# Patient Record
Sex: Male | Born: 1965
Health system: Southern US, Community
[De-identification: ages and names within clinical notes are randomized; demographics above are authoritative.]

## PROBLEM LIST (undated history)

## (undated) DIAGNOSIS — F32A Depression, unspecified: Secondary | ICD-10-CM

## (undated) DIAGNOSIS — F329 Major depressive disorder, single episode, unspecified: Secondary | ICD-10-CM

## (undated) DIAGNOSIS — I10 Essential (primary) hypertension: Secondary | ICD-10-CM

## (undated) DIAGNOSIS — R519 Headache, unspecified: Secondary | ICD-10-CM

## (undated) DIAGNOSIS — R12 Heartburn: Secondary | ICD-10-CM

## (undated) DIAGNOSIS — F419 Anxiety disorder, unspecified: Secondary | ICD-10-CM

## (undated) DIAGNOSIS — R51 Headache: Secondary | ICD-10-CM

## (undated) DIAGNOSIS — E119 Type 2 diabetes mellitus without complications: Secondary | ICD-10-CM

## (undated) DIAGNOSIS — R413 Other amnesia: Secondary | ICD-10-CM

## (undated) HISTORY — DX: Depression, unspecified: F32.A

## (undated) HISTORY — DX: Other amnesia: R41.3

## (undated) HISTORY — DX: Headache, unspecified: R51.9

## (undated) HISTORY — DX: Type 2 diabetes mellitus without complications: E11.9

## (undated) HISTORY — DX: Heartburn: R12

## (undated) HISTORY — DX: Essential (primary) hypertension: I10

## (undated) HISTORY — DX: Major depressive disorder, single episode, unspecified: F32.9

## (undated) HISTORY — DX: Headache: R51

## (undated) HISTORY — PX: NO PAST SURGERIES: SHX2092

## (undated) HISTORY — DX: Anxiety disorder, unspecified: F41.9

## (undated) HISTORY — PX: OTHER SURGICAL HISTORY: SHX169

---

## 2008-05-06 ENCOUNTER — Ambulatory Visit: Payer: Self-pay | Admitting: Family Medicine

## 2008-07-13 DIAGNOSIS — E119 Type 2 diabetes mellitus without complications: Secondary | ICD-10-CM

## 2008-07-13 HISTORY — DX: Type 2 diabetes mellitus without complications: E11.9

## 2009-06-17 ENCOUNTER — Ambulatory Visit: Payer: Self-pay | Admitting: Family Medicine

## 2009-10-01 ENCOUNTER — Ambulatory Visit: Payer: Self-pay | Admitting: Family Medicine

## 2009-12-17 ENCOUNTER — Ambulatory Visit: Payer: Self-pay | Admitting: Urology

## 2010-07-14 ENCOUNTER — Ambulatory Visit: Payer: Self-pay | Admitting: Internal Medicine

## 2012-08-17 ENCOUNTER — Ambulatory Visit: Payer: Self-pay | Admitting: Unknown Physician Specialty

## 2012-10-06 ENCOUNTER — Ambulatory Visit: Payer: Self-pay | Admitting: Gastroenterology

## 2012-12-21 ENCOUNTER — Ambulatory Visit: Payer: Self-pay | Admitting: Family Medicine

## 2013-01-04 ENCOUNTER — Ambulatory Visit: Payer: Self-pay | Admitting: Family Medicine

## 2013-01-04 LAB — CREATININE, SERUM: EGFR (Non-African Amer.): >60

## 2014-12-06 ENCOUNTER — Other Ambulatory Visit: Payer: Self-pay | Admitting: Family Medicine

## 2014-12-06 DIAGNOSIS — M5412 Radiculopathy, cervical region: Secondary | ICD-10-CM

## 2014-12-14 ENCOUNTER — Ambulatory Visit: Payer: No Typology Code available for payment source

## 2014-12-19 ENCOUNTER — Ambulatory Visit
Admission: RE | Admit: 2014-12-19 | Discharge: 2014-12-19 | Disposition: A | Discharge status: Home / Self Care | Payer: No Typology Code available for payment source | Source: Ambulatory Visit | Attending: Family Medicine | Admitting: Family Medicine

## 2014-12-19 DIAGNOSIS — M5022 Other cervical disc displacement, mid-cervical region: Secondary | ICD-10-CM | POA: Diagnosis not present

## 2014-12-19 DIAGNOSIS — M5412 Radiculopathy, cervical region: Secondary | ICD-10-CM

## 2014-12-19 DIAGNOSIS — M5030 Other cervical disc degeneration, unspecified cervical region: Secondary | ICD-10-CM | POA: Diagnosis not present

## 2014-12-19 DIAGNOSIS — M4802 Spinal stenosis, cervical region: Secondary | ICD-10-CM | POA: Insufficient documentation

## 2015-02-12 ENCOUNTER — Other Ambulatory Visit: Payer: Self-pay | Admitting: Family Medicine

## 2015-02-12 DIAGNOSIS — N5089 Other specified disorders of the male genital organs: Secondary | ICD-10-CM

## 2015-02-15 ENCOUNTER — Ambulatory Visit: Payer: No Typology Code available for payment source

## 2015-02-19 ENCOUNTER — Ambulatory Visit
Admission: RE | Admit: 2015-02-19 | Discharge: 2015-02-19 | Disposition: A | Discharge status: Home / Self Care | Payer: No Typology Code available for payment source | Source: Ambulatory Visit | Attending: Family Medicine | Admitting: Family Medicine

## 2015-02-19 DIAGNOSIS — N5089 Other specified disorders of the male genital organs: Secondary | ICD-10-CM

## 2015-02-19 DIAGNOSIS — N509 Disorder of male genital organs, unspecified: Secondary | ICD-10-CM | POA: Insufficient documentation

## 2015-02-19 DIAGNOSIS — N433 Hydrocele, unspecified: Secondary | ICD-10-CM | POA: Diagnosis not present

## 2015-02-19 DIAGNOSIS — I861 Scrotal varices: Secondary | ICD-10-CM | POA: Diagnosis not present

## 2015-02-19 DIAGNOSIS — N503 Cyst of epididymis: Secondary | ICD-10-CM | POA: Insufficient documentation

## 2015-06-27 ENCOUNTER — Ambulatory Visit: Payer: Self-pay | Admitting: Urology

## 2015-07-04 ENCOUNTER — Ambulatory Visit (INDEPENDENT_AMBULATORY_CARE_PROVIDER_SITE_OTHER): Payer: Managed Care, Other (non HMO) | Admitting: Urology

## 2015-07-04 ENCOUNTER — Encounter: Payer: Self-pay | Admitting: Urology

## 2015-07-04 VITALS — BP 135/75 | HR 97 | Ht 78.0 in | Wt 222.3 lb

## 2015-07-04 DIAGNOSIS — I861 Scrotal varices: Secondary | ICD-10-CM | POA: Diagnosis not present

## 2015-07-04 DIAGNOSIS — N50819 Testicular pain, unspecified: Secondary | ICD-10-CM

## 2015-07-04 DIAGNOSIS — N433 Hydrocele, unspecified: Secondary | ICD-10-CM

## 2015-07-04 LAB — MICROSCOPIC EXAMINATION: BACTERIA UA: NONE SEEN

## 2015-07-04 LAB — URINALYSIS, COMPLETE
Bilirubin, UA: NEGATIVE
KETONES UA: NEGATIVE
Leukocytes, UA: NEGATIVE
NITRITE UA: NEGATIVE
Protein, UA: NEGATIVE
RBC UA: NEGATIVE
SPEC GRAV UA: 1.02 (ref 1.005–1.030)
UUROB: 0.2 mg/dL (ref 0.2–1.0)
pH, UA: 5.5 (ref 5.0–7.5)

## 2015-07-04 NOTE — Progress Notes (Signed)
07/04/2015 3:18 PM   Miguel Hughes 07/21/1965 [id number]  Referring provider: Dion Body, MD [facility] [place], [place] [address]  Chief Complaint  Patient presents with  . Testicular cyst    referred by Dr. Adella Hare    HPI: Patient is a 49 year old African-American male who is referred to [place] by Dr. Eddie Dibbles for a testicular cyst.  Patient states that since 2010 he's been having testicular pain and an increase in the size of the scrotum.   He and his wife states they have been told by several practitioners that it was nothing to worry about and not to be concerned until he had increasing pain in his scrotum.  He was last seen at [facility] and was placed on antibiotics, but he did not experience any relief with his testicular pain.  Now, he states his scrotum has increased twice in size.  The scrotum aches continuously. He is experiencing bouts of intense pain that can last for several minutes to hours.  The  size of his scrotum does not fluctuate throughout the day. He has not had any difficulty with urination or erections.  He has not experienced dysuria, gross hematuria or suprapubic pain. He also has not experienced any abdominal pain, flank pain or back pain.  He does not have any prior history of nephrolithiasis.  He has not had any associated fevers, chills, nausea or vomiting.  His UA today was unremarkable.  He does have a maternal uncle that was diagnosed with prostate cancer. His uncle is still living.  A scrotal ultrasound performed on 02/19/2015 noted no significant testicular mass. No evidence of torsion. Bilateral epididymal cysts. Bilateral hydroceles, moderate on the right, small on the left. Small bilateral varicoceles. I reviewed the images with the patient and agree with the findings.  PMH: Past Medical History  Diagnosis Date  . Anxiety and depression   . Heartburn   . Diabetes ([place])   . HTN (hypertension)   . Headache   .  Memory loss     Surgical History: Past Surgical History  Procedure Laterality Date  . None      Home Medications:    Medication List       This list is accurate as of: 07/04/15  3:18 PM.  Always use your most recent med list.               aspirin 325 MG tablet  Take by mouth.     cyclobenzaprine 10 MG tablet  Commonly known as:  FLEXERIL  TAKE 1 TABLET BY MOUTH 3 TIMES A DAY AS NEEDED MUSCLE SPASM     dexamethasone 2 MG tablet  Commonly known as:  DECADRON  Reported on 07/04/2015     gabapentin 300 MG capsule  Commonly known as:  NEURONTIN  Take 600 mg by mouth.     insulin aspart 100 UNIT/ML FlexPen  Commonly known as:  NOVOLOG  Reported on 07/04/2015     lisinopril 20 MG tablet  Commonly known as:  PRINIVIL,ZESTRIL  Take by mouth.     metFORMIN 1000 MG tablet  Commonly known as:  GLUCOPHAGE  Take by mouth.     sitaGLIPtin 25 MG tablet  Commonly known as:  JANUVIA  Take by mouth.     TOUJEO SOLOSTAR 300 UNIT/ML Sopn  Generic drug:  Insulin Glargine  Inject 4 Units into the skin.     V-R IBUPROFEN JR 100 MG tablet  Generic drug:  ibuprofen  Take by mouth. Reported on 07/04/2015     Vitamin D (Ergocalciferol) 50000 UNITS Caps capsule  Commonly known as:  DRISDOL  Take by mouth.        Allergies: No Known Allergies  Family History: Family History  Problem Relation Age of Onset  . Kidney disease Neg Hx   . Prostate cancer Maternal Uncle   . Asthma Mother   . Hypertension      Social History:  reports that he has never smoked. He does not have any smokeless tobacco history on file. He reports that he does not drink alcohol or use illicit drugs.  ROS: UROLOGY Frequent Urination?: No Hard to postpone urination?: No Burning/pain with urination?: No Get up at night to urinate?: No Leakage of urine?: No Urine stream starts and stops?: No Trouble starting stream?: No Do you have to strain to urinate?: No Blood in urine?: No Urinary  tract infection?: No Sexually transmitted disease?: No Injury to kidneys or bladder?: No Painful intercourse?: No Weak stream?: No Erection problems?: No Penile pain?: No  Gastrointestinal Nausea?: No Vomiting?: No Indigestion/heartburn?: Yes Diarrhea?: Yes Constipation?: Yes  Constitutional Fever: No Night sweats?: Yes Weight loss?: No Fatigue?: No  Skin Skin rash/lesions?: No Itching?: No  Eyes Blurred vision?: Yes Double vision?: No  Ears/Nose/Throat Sore throat?: No Sinus problems?: No  Hematologic/Lymphatic Swollen glands?: No Easy bruising?: No  Cardiovascular Leg swelling?: No Chest pain?: No  Respiratory Cough?: No Shortness of breath?: No  Endocrine Excessive thirst?: No  Musculoskeletal Back pain?: Yes Joint pain?: No  Neurological Headaches?: Yes Dizziness?: Yes  Psychologic Depression?: Yes Anxiety?: Yes  Physical Exam: BP 135/75 mmHg  Pulse 97  Ht 6\' 6"  (1.981 m)  Wt 222 lb 4.8 oz (100.835 kg)  BMI 25.69 kg/m2  Constitutional: Well nourished. Alert and oriented, No acute distress. HEENT: [place] AT, moist mucus membranes. Trachea midline, no masses. Cardiovascular: No clubbing, cyanosis, or edema. Respiratory: Normal respiratory effort, no increased work of breathing. GI: Abdomen is soft, non tender, non distended, no abdominal masses. Liver and spleen not palpable.  No hernias appreciated.  Stool sample for occult testing is not indicated.   GU: No CVA tenderness.  No bladder fullness or masses.  Patient with circumcised phallus.  Urethral meatus is patent.  No penile discharge. No penile lesions or rashes. Scrotum without lesions, cysts, rashes and/or edema.  Testicles are located scrotally bilaterally. No masses are appreciated in the testicles. Left and right epididymis are normal.  Epididymal cysts are palpated. The hydroceles are not impressive on this exam. Because of this, I asked Dr. Pilar Jarvis to also examine the patient.  He  noted a reducible sac contained within the right scrotum. He advised further evaluation with general surgery for possible hernia. Rectal: Patient with  normal sphincter tone. Anus and perineum without scarring or rashes. No rectal masses are appreciated. Prostate is approximately 50 grams, no nodules are appreciated. Seminal vesicles are normal. Skin: No rashes, bruises or suspicious lesions. Lymph: No cervical or inguinal adenopathy. Neurologic: Grossly intact, no focal deficits, moving all 4 extremities. Psychiatric: Normal mood and affect.  Laboratory Data:  Lab Results  Component Value Date   CREATININE 1.12 01/04/2013    PSA history  1.52 ng/mL on 06/14/2015  0.33 ng/mL of free PSA on 06/14/2015  Percent free 21.7% on 06/14/2015  Urinalysis Results for orders placed or performed in visit on 07/04/15  Microscopic Examination  Result Value Ref Range   WBC, UA 0-5 0 -  5 /hpf   RBC, UA 0-2 0 -  2 /hpf   Epithelial Cells (non renal) 0-10 0 - 10 /hpf   Mucus, UA Present (A) Not Estab.   Bacteria, UA None seen None seen/Few  Urinalysis, Complete  Result Value Ref Range   Specific Gravity, UA 1.020 1.005 - 1.030   pH, UA 5.5 5.0 - 7.5   Color, UA Yellow Yellow   Appearance Ur Clear Clear   Leukocytes, UA Negative Negative   Protein, UA Negative Negative/Trace   Glucose, UA Trace (A) Negative   Ketones, UA Negative Negative   RBC, UA Negative Negative   Bilirubin, UA Negative Negative   Urobilinogen, Ur 0.2 0.2 - 1.0 mg/dL   Nitrite, UA Negative Negative   Microscopic Examination See below:    Pertinent Imaging: CLINICAL DATA: Scrotal mass.  EXAM: SCROTAL ULTRASOUND  DOPPLER ULTRASOUND OF THE TESTICLES  TECHNIQUE: Complete ultrasound examination of the testicles, epididymis, and other scrotal structures was performed. Color and spectral Doppler ultrasound were also utilized to evaluate blood flow to the testicles.  COMPARISON:  12/17/2009.  FINDINGS: Right testicle  Measurements: 4.6 x 2.8 x 3.2 cm. No mass or microlithiasis visualized. Cyst noted in the rete testes. These are slightly more prominent than on prior exam.  Left testicle  Measurements: 4.9 x 2.7 x 2.9 cm. No mass or microlithiasis visualized.  Right epididymis: Epididymal cysts, the largest measures 2.2 cm  Left epididymis: Epididymal cysts, the largest measures 1.0 cm  Hydrocele: Moderate right hydrocele. Small left hydrocele.  Varicocele: Small varicoceles bilaterally.  Pulsed Doppler interrogation of both testes demonstrates normal low resistance arterial and venous waveforms bilaterally.  IMPRESSION: 1. No significant testicular mass. No evidence of torsion.  2. Bilateral epididymal cyst.  3. Bilateral hydroceles, moderate on the right, small left.  4. Small bilateral varicoceles.   Electronically Signed  By: [place]  On: 02/19/2015 17:00   Assessment & Plan:    1. Testicular pain:   A reducible sac was palpated in the right scrotum. He'll be referred to general surgery for further evaluation for possible hernia to see if this is the source of his discomfort.  He will also take scheduled ibuprofen at 600 mg 4 times daily for 2 weeks to see if this diminishes his testicular pain.  - Urinalysis, Complete - CULTURE, URINE COMPREHENSIVE  2. Bilateral hydroceles:   Hydroceles are not impressive on this exam.  I do not believe that this is the source of his testicular pain at this time.  He will continue conservative management of the hydroceles at this time.  He will follow-up in 2 weeks and after his appointment with general surgery if the pain in his testicles persists.  3. Bilateral varicoceles:   Varicoceles are not impressive on this exam. He will continue conservative management of the varicoceles at this time. He'll be referred to general surgery for their opinion regarding a possible  hernia and he will be taking scheduled ibuprofen for 2 weeks.   No Follow-up on file.  Zara Council, [facility] [address], [place], [place] [address] [phone]

## 2015-07-05 DIAGNOSIS — N50819 Testicular pain, unspecified: Secondary | ICD-10-CM | POA: Insufficient documentation

## 2015-07-05 DIAGNOSIS — I861 Scrotal varices: Secondary | ICD-10-CM | POA: Insufficient documentation

## 2015-07-05 DIAGNOSIS — N433 Hydrocele, unspecified: Secondary | ICD-10-CM | POA: Insufficient documentation

## 2015-07-06 LAB — CULTURE, URINE COMPREHENSIVE

## 2015-07-24 ENCOUNTER — Ambulatory Visit: Payer: Self-pay | Admitting: General Surgery

## 2015-08-05 ENCOUNTER — Other Ambulatory Visit: Payer: Self-pay | Admitting: Rheumatology

## 2015-08-05 DIAGNOSIS — G252 Other specified forms of tremor: Secondary | ICD-10-CM

## 2015-08-07 ENCOUNTER — Ambulatory Visit (INDEPENDENT_AMBULATORY_CARE_PROVIDER_SITE_OTHER): Payer: Managed Care, Other (non HMO) | Admitting: General Surgery

## 2015-08-07 ENCOUNTER — Encounter: Payer: Self-pay | Admitting: General Surgery

## 2015-08-07 VITALS — BP 142/92 | HR 70 | Resp 12 | Ht 78.0 in | Wt 227.0 lb

## 2015-08-07 DIAGNOSIS — N5089 Other specified disorders of the male genital organs: Secondary | ICD-10-CM

## 2015-08-07 DIAGNOSIS — N509 Disorder of male genital organs, unspecified: Secondary | ICD-10-CM

## 2015-08-07 NOTE — Patient Instructions (Signed)
The patient is aware to call back for any questions or concerns.  

## 2015-08-07 NOTE — Progress Notes (Signed)
Patient ID: Miguel Hughes, male   DOB: Feb 14, 1966, 50 y.o.   MRN: [id number]  Chief Complaint  Patient presents with  . Other    bilateral inguinal hernia    HPI Miguel Hughes is a 50 y.o. male.  Here today for evaluation of possible inguinal hernia. He does admit to testicle knots and discomfort. He states that the discomfort (intermitent, sharp) has been there for several years but last year it seemed to become worse and larger. The pain is not related to activity. The right side is worse than the left. He had a scrotum ultrasound on 02-19-15. He is here today with his wife of 22 years. He has been dealing with headaches and neurological issues. Diabetes is stable.  HPI  Past Medical History  Diagnosis Date  . Anxiety and depression   . Heartburn   . HTN (hypertension)   . Headache   . Memory loss   . Diabetes [facility]) 2010    Dr Eddie Dibbles    Past Surgical History  Procedure Laterality Date  . None      Family History  Problem Relation Age of Onset  . Kidney disease Neg Hx   . Prostate cancer Maternal Uncle   . Asthma Mother   . Hypertension      Social History Social History  Substance Use Topics  . Smoking status: Never Smoker   . Smokeless tobacco: Never Used  . Alcohol Use: No    No Known Allergies  Current Outpatient Prescriptions  Medication Sig Dispense Refill  . aspirin 325 MG tablet Take by mouth every 4 (four) hours as needed.     . cyclobenzaprine (FLEXERIL) 10 MG tablet TAKE 1 TABLET BY MOUTH 3 TIMES A DAY AS NEEDED MUSCLE SPASM  5  . ibuprofen (ADVIL,MOTRIN) 200 MG tablet Take 200 mg by mouth every 6 (six) hours as needed.    . Insulin Glargine (TOUJEO SOLOSTAR) 300 UNIT/ML SOPN Inject 4 Units into the skin as needed.     Marland Kitchen lisinopril (PRINIVIL,ZESTRIL) 20 MG tablet Take by mouth.    . metFORMIN (GLUCOPHAGE) 1000 MG tablet Take by mouth.    . sitaGLIPtin (JANUVIA) 25 MG tablet Take by mouth.    . Vitamin D, Ergocalciferol, (DRISDOL) 50000 UNITS  CAPS capsule Take by mouth.     No current facility-administered medications for this visit.    Review of Systems Review of Systems  Gastrointestinal: Negative for diarrhea and constipation.  Genitourinary: Negative for difficulty urinating.    Blood pressure 142/92, pulse 70, resp. rate 12, height 6\' 6"  (1.981 m), weight 227 lb (102.967 kg).  Physical Exam Physical Exam  Constitutional: He is oriented to person, place, and time. He appears well-developed and well-nourished.  HENT:  Mouth/Throat: Oropharynx is clear and moist.  Eyes: Conjunctivae are normal. No scleral icterus.  Neck: Neck supple.  Cardiovascular: Normal rate, regular rhythm and normal heart sounds.   Pulmonary/Chest: Effort normal and breath sounds normal.  Abdominal: Soft. Normal appearance. There is no tenderness. Hernia confirmed negative in the right inguinal area and confirmed negative in the left inguinal area.  Genitourinary:     scrotum tenderness.  Lymphadenopathy:    He has no cervical adenopathy.  Neurological: He is alert and oriented to person, place, and time.  Skin: Skin is warm and dry.  Psychiatric: His behavior is normal.    Data Reviewed 07/04/2015 urology evaluation note reviewed.  Ultrasound of the testes dated 02/19/2015 was reviewed. Right epididymal cyst measuring  2.2 cm, left dental cyst measuring 1 cm.  Assessment    Pain at the site of the right more so than the left epididymal cyst. No evidence of inguinal hernia.    Plan    The patient has noted gradual increase in size of the lesions in the scrotum, and increasing pain especially on the right size. No clinical evidence of inguinal hernia noted on today's exam.  The patient will likely benefit from excision of the epididymal cyst, and he should contact the urology service for reevaluation in this regard.    Follow up as needed.   PCP:  Dion Body RefZara Council, PA-C  This information has been  scribed by Karie Fetch RNBC.   Robert Bellow 08/08/2015, 6:00 PM

## 2015-08-08 DIAGNOSIS — N5089 Other specified disorders of the male genital organs: Secondary | ICD-10-CM | POA: Insufficient documentation

## 2015-08-23 ENCOUNTER — Ambulatory Visit: Payer: No Typology Code available for payment source

## 2015-09-12 ENCOUNTER — Telehealth: Payer: Self-pay | Admitting: Urology

## 2015-09-12 NOTE — Telephone Encounter (Signed)
Spoke with pt wife in reference to groin pain. Wife stated pt is still having pain. Wife was transferred to the front to make f/u appt with MD.

## 2015-09-12 NOTE — Telephone Encounter (Signed)
Patient will need a follow up appointment with one of our physicians if he is still having groin pain.

## 2015-09-17 ENCOUNTER — Ambulatory Visit (INDEPENDENT_AMBULATORY_CARE_PROVIDER_SITE_OTHER): Payer: Managed Care, Other (non HMO) | Admitting: Urology

## 2015-09-17 ENCOUNTER — Encounter: Payer: Self-pay | Admitting: Urology

## 2015-09-17 VITALS — BP 148/92 | HR 90 | Resp 16 | Ht 78.0 in | Wt 227.8 lb

## 2015-09-17 DIAGNOSIS — N433 Hydrocele, unspecified: Secondary | ICD-10-CM

## 2015-09-17 DIAGNOSIS — G8929 Other chronic pain: Secondary | ICD-10-CM

## 2015-09-17 DIAGNOSIS — N50819 Testicular pain, unspecified: Secondary | ICD-10-CM | POA: Diagnosis not present

## 2015-09-17 DIAGNOSIS — N503 Cyst of epididymis: Secondary | ICD-10-CM | POA: Diagnosis not present

## 2015-09-17 NOTE — Progress Notes (Signed)
09/17/2015 9:38 PM   Miguel Hughes 04/20/1966 [id number]  Referring provider: Dion Body, MD [place] [facility] [place], [place] [address]  Chief Complaint  Patient presents with  . Hydrocele    bilateral    HPI: 50 year old male with chronic testicular pain who returns today after being evaluated by general surgery for possible right inguinal hernia. Dr. Bary Castilla did not appreciate any significant hernia and sent the patient back to [place] for further evaluation.    Patient reports that he has had small cysts just above his testicles for approximately 20 years, but over the past 2 years, these have become painful and started to enlarge over the past 1 year. He has right greater than left testicular pain which is mostly dull and aching but occasionally stabbing. He is been seen by multiple specialists including [facility] and treated with multiple courses of antibiotics without improvement. Scrotal ultrasound shows 2.2 cm right epididymal cyst and 1 cm left epididymal cyst. He also has insignificant bilateral hydroceles which are mostly only appreciated on ultrasound. There is no other scrotal pathology identifiable on ultrasound performed on 02/2015.    Patient and his wife today reports that he is been diagnosed with unknown neurological condition. He is struggling with multiple ailments including progressive memory loss, headaches, confusion, and tremors. He is seen multiple specialist and has been treated with multiple interventions including courses of steroids, nonsteroidals, muscle relaxants and gabapentin. During each of these medication trials, he continued to have significant testicular/scrotal pain in none of these have helped with his discomfort.  They also stated he was not able to tolerate physical therapy due to his neurological conditions.  No voiding issues.  No history of testicular/ scrotal trauma or previous vasectomy.  Prostate cancer  screening up to date.   PMH: Past Medical History  Diagnosis Date  . Anxiety and depression   . Heartburn   . HTN (hypertension)   . Headache   . Memory loss   . Diabetes [facility]) 2010    Dr Eddie Dibbles    Surgical History: Past Surgical History  Procedure Laterality Date  . None      Home Medications:    Medication List       This list is accurate as of: 09/17/15  9:38 PM.  Always use your most recent med list.               aspirin 325 MG tablet  Take by mouth every 4 (four) hours as needed.     cyclobenzaprine 10 MG tablet  Commonly known as:  FLEXERIL  TAKE 1 TABLET BY MOUTH 3 TIMES A DAY AS NEEDED MUSCLE SPASM     ibuprofen 200 MG tablet  Commonly known as:  ADVIL,MOTRIN  Take 200 mg by mouth every 6 (six) hours as needed.     lisinopril 20 MG tablet  Commonly known as:  PRINIVIL,ZESTRIL  Take by mouth.     metFORMIN 1000 MG tablet  Commonly known as:  GLUCOPHAGE  Take by mouth.     sitaGLIPtin 25 MG tablet  Commonly known as:  JANUVIA  Take by mouth.     TOUJEO SOLOSTAR 300 UNIT/ML Sopn  Generic drug:  Insulin Glargine  Inject 4 Units into the skin as needed. Reported on 09/17/2015     Vitamin D (Ergocalciferol) 50000 units Caps capsule  Commonly known as:  DRISDOL  Take by mouth.        Allergies: No Known Allergies  Family History: Family History  Problem Relation Age of Onset  . Kidney disease Neg Hx   . Prostate cancer Maternal Uncle   . Asthma Mother   . Hypertension      Social History:  reports that he has never smoked. He has never used smokeless tobacco. He reports that he does not drink alcohol or use illicit drugs.  ROS: UROLOGY Frequent Urination?: No Hard to postpone urination?: No Burning/pain with urination?: No Get up at night to urinate?: No Leakage of urine?: No Urine stream starts and stops?: No Trouble starting stream?: No Do you have to strain to urinate?: No Blood in urine?: No Urinary tract infection?:  No Sexually transmitted disease?: No Injury to kidneys or bladder?: No Painful intercourse?: No Weak stream?: No Erection problems?: No Penile pain?: No  Gastrointestinal Nausea?: No Vomiting?: No Indigestion/heartburn?: Yes Diarrhea?: Yes Constipation?: Yes  Constitutional Fever: No Night sweats?: Yes Weight loss?: No Fatigue?: No  Skin Skin rash/lesions?: No Itching?: No  Eyes Blurred vision?: No Double vision?: No  Ears/Nose/Throat Sore throat?: No Sinus problems?: No  Hematologic/Lymphatic Swollen glands?: No Easy bruising?: No  Cardiovascular Leg swelling?: No Chest pain?: No  Respiratory Cough?: No Shortness of breath?: No  Endocrine Excessive thirst?: No  Musculoskeletal Back pain?: No Joint pain?: No  Neurological Headaches?: Yes Dizziness?: Yes  Psychologic Depression?: Yes Anxiety?: No  Physical Exam: BP 148/92 mmHg  Pulse 90  Resp 16  Ht 6\' 6"  (1.981 m)  Wt 227 lb 12.8 oz (103.329 kg)  BMI 26.33 kg/m2  Constitutional:  Alert and oriented, No acute distress. HEENT: [place] AT, moist mucus membranes.  Trachea midline, no masses. Cardiovascular: No clubbing, cyanosis, or edema. Respiratory: Normal respiratory effort, no increased work of breathing. GI: Abdomen is soft, nontender, nondistended, no abdominal masses GU: No CVA tenderness. Unremarkable scrotum without fullness or edema. Bilateral descended testicles. Approximate 2 cm right epididymal cyst appreciated on exam, and the smaller pea-sized cyst on left epididymis. Testicles otherwise normal without masses. Diffuse scrotal/testicular tenderness especially on the bilateral epididymies is on examination today. No significant hydroceles appreciated. Skin: No rashes, bruises or suspicious lesions. Lymph: No inguinal adenopathy. Neurologic: Grossly intact, no focal deficits, moving all 4 extremities. Psychiatric: Normal mood and affect.  Laboratory Data: PSA 1.52 on  07/04/15  Urinalysis Today's and previous UAs reviewed, negative  Pertinent Imaging: Scrotal ultrasound reviewed personally today Study Result     CLINICAL DATA: Scrotal mass.  EXAM: SCROTAL ULTRASOUND  DOPPLER ULTRASOUND OF THE TESTICLES  TECHNIQUE: Complete ultrasound examination of the testicles, epididymis, and other scrotal structures was performed. Color and spectral Doppler ultrasound were also utilized to evaluate blood flow to the testicles.  COMPARISON: 12/17/2009.  FINDINGS: Right testicle  Measurements: 4.6 x 2.8 x 3.2 cm. No mass or microlithiasis visualized. Cyst noted in the rete testes. These are slightly more prominent than on prior exam.  Left testicle  Measurements: 4.9 x 2.7 x 2.9 cm. No mass or microlithiasis visualized.  Right epididymis: Epididymal cysts, the largest measures 2.2 cm  Left epididymis: Epididymal cysts, the largest measures 1.0 cm  Hydrocele: Moderate right hydrocele. Small left hydrocele.  Varicocele: Small varicoceles bilaterally.  Pulsed Doppler interrogation of both testes demonstrates normal low resistance arterial and venous waveforms bilaterally.  IMPRESSION: 1. No significant testicular mass. No evidence of torsion.  2. Bilateral epididymal cyst.  3. Bilateral hydroceles, moderate on the right, small left.  4. Small bilateral varicoceles.   Electronically Signed  By: Marcello Moores Register  On: 02/19/2015 17:00     Assessment & Plan:    1. Chronic pain in testicle Lengthy discussion today discussing the overall clinical picture. Although the patient is quite convinced that his epididymal cysts of the cause of his pain and desires to have them excised, I feel that it is highly unlikely that these are the source of his discomfort.  Typically, epididymal cyst do not themselves cause discomfort or pain.  Unfortunately, he has not improved clinically with courses of antibiotics,  steroids, nonsteroidals, or gabapentin therefore ongoing epididymitis is also unlikely.    Discussed chronic pain syndromes including chronic testicular pain today. I offered attempt at bilateral cord block.   I have also discussed referring patient to physical therapy for evaluation/treatment of chronic testicular pain/pelvic therapy.  He is agreeable with this plan.  If the above measures fail, may consider excision of his epididymal cysts in the future although I have made it very clear today that this will not likely improve his discomfort and may worsen his symptoms.  - Urinalysis, Complete - Ambulatory referral to Physical Therapy  2. Epididymal cyst As above  3. Bilateral hydrocele Clinically insignificant bilateral hydroceles only appreciated on ultrasound, iliac appreciable on exam today. At this, patient is very concerned about this and reports to be bothered by scrotal enlargement.   Return for nerve block in 1-2 weeks.  Hollice Espy, MD  [facility] [address] [place], [place] [address] [phone]  I spent 25 min with this patient of which greater than 50% was spent in counseling and coordination of care with the patient.

## 2015-09-18 LAB — MICROSCOPIC EXAMINATION: BACTERIA UA: NONE SEEN

## 2015-09-18 LAB — URINALYSIS, COMPLETE
Bilirubin, UA: NEGATIVE
Ketones, UA: NEGATIVE
Leukocytes, UA: NEGATIVE
NITRITE UA: NEGATIVE
PH UA: 5.5 (ref 5.0–7.5)
Protein, UA: NEGATIVE
Specific Gravity, UA: 1.015 (ref 1.005–1.030)
UUROB: 0.2 mg/dL (ref 0.2–1.0)

## 2015-09-24 ENCOUNTER — Telehealth: Payer: Self-pay | Admitting: Radiology

## 2015-09-24 NOTE — Telephone Encounter (Signed)
LMOM

## 2015-09-24 NOTE — Telephone Encounter (Signed)
Pt's wife called stating pt wants to try Physical Therapy prior to undergoing nerve block. Pt doesn't currently have a f/u appt scheduled. Please advise.

## 2015-09-24 NOTE — Telephone Encounter (Signed)
OK, great idea.  Have him call us to book follow up once he has been to PT when he is ready to have the block.    He must not have stopped to make follow up at checkout.    Hollice Espy, MD

## 2015-10-02 ENCOUNTER — Ambulatory Visit: Payer: Managed Care, Other (non HMO) | Admitting: Urology

## 2015-10-23 ENCOUNTER — Ambulatory Visit: Payer: Managed Care, Other (non HMO) | Admitting: Physical Therapy

## 2015-10-30 ENCOUNTER — Encounter: Payer: No Typology Code available for payment source | Admitting: Physical Therapy

## 2015-11-06 ENCOUNTER — Encounter: Payer: No Typology Code available for payment source | Admitting: Physical Therapy

## 2015-11-26 ENCOUNTER — Telehealth: Payer: Self-pay | Admitting: Urology

## 2015-11-26 NOTE — Telephone Encounter (Signed)
Physical Rehabilitation Services department attempted to reach the patient multiple times to schedule physical therapy.  Patient stated that he "has too much going on right now to do PT."

## 2015-12-11 ENCOUNTER — Ambulatory Visit: Payer: Managed Care, Other (non HMO) | Admitting: Physical Therapy

## 2016-06-23 ENCOUNTER — Other Ambulatory Visit: Payer: Self-pay | Admitting: Gastroenterology

## 2016-06-23 DIAGNOSIS — R1084 Generalized abdominal pain: Secondary | ICD-10-CM

## 2016-06-23 DIAGNOSIS — R1032 Left lower quadrant pain: Secondary | ICD-10-CM

## 2016-06-23 DIAGNOSIS — K625 Hemorrhage of anus and rectum: Secondary | ICD-10-CM

## 2016-06-23 DIAGNOSIS — R1031 Right lower quadrant pain: Secondary | ICD-10-CM

## 2016-06-30 ENCOUNTER — Ambulatory Visit
Admission: RE | Admit: 2016-06-30 | Discharge: 2016-06-30 | Disposition: A | Discharge status: Home / Self Care | Payer: Commercial Managed Care - HMO | Source: Ambulatory Visit | Attending: Gastroenterology | Admitting: Gastroenterology

## 2016-06-30 DIAGNOSIS — R1031 Right lower quadrant pain: Secondary | ICD-10-CM | POA: Insufficient documentation

## 2016-06-30 DIAGNOSIS — R1084 Generalized abdominal pain: Secondary | ICD-10-CM | POA: Diagnosis not present

## 2016-06-30 DIAGNOSIS — R1032 Left lower quadrant pain: Secondary | ICD-10-CM | POA: Diagnosis present

## 2016-06-30 DIAGNOSIS — K625 Hemorrhage of anus and rectum: Secondary | ICD-10-CM | POA: Diagnosis not present

## 2016-06-30 MED ORDER — IOPAMIDOL (ISOVUE-300) INJECTION 61%
100.0000 mL | Freq: Once | INTRAVENOUS | Status: AC | PRN
  Administered 2016-06-30: 100 mL via INTRAVENOUS

## 2016-06-30 MED ORDER — IOPAMIDOL (ISOVUE-300) INJECTION 61%: 100.0000 mL | Freq: Once | INTRAVENOUS | Status: DC | PRN

## 2016-08-19 DIAGNOSIS — D509 Iron deficiency anemia, unspecified: Secondary | ICD-10-CM | POA: Diagnosis not present

## 2016-08-19 DIAGNOSIS — K625 Hemorrhage of anus and rectum: Secondary | ICD-10-CM | POA: Diagnosis not present

## 2016-08-19 DIAGNOSIS — E7800 Pure hypercholesterolemia, unspecified: Secondary | ICD-10-CM | POA: Diagnosis not present

## 2016-08-19 DIAGNOSIS — E119 Type 2 diabetes mellitus without complications: Secondary | ICD-10-CM | POA: Diagnosis not present

## 2016-08-25 DIAGNOSIS — D509 Iron deficiency anemia, unspecified: Secondary | ICD-10-CM | POA: Diagnosis not present

## 2016-08-25 DIAGNOSIS — E119 Type 2 diabetes mellitus without complications: Secondary | ICD-10-CM | POA: Diagnosis not present

## 2016-08-25 DIAGNOSIS — E7800 Pure hypercholesterolemia, unspecified: Secondary | ICD-10-CM | POA: Diagnosis not present

## 2016-08-26 ENCOUNTER — Encounter: Payer: Self-pay | Admitting: *Deleted

## 2016-08-27 ENCOUNTER — Ambulatory Visit
Admission: RE | Admit: 2016-08-27 | Discharge: 2016-08-27 | Disposition: A | Discharge status: Home / Self Care | Payer: Commercial Managed Care - HMO | Source: Ambulatory Visit | Attending: Gastroenterology | Admitting: Gastroenterology

## 2016-08-27 ENCOUNTER — Ambulatory Visit: Payer: Commercial Managed Care - HMO | Admitting: Anesthesiology

## 2016-08-27 ENCOUNTER — Encounter
Admission: RE | Disposition: A | Discharge status: Home / Self Care | Payer: Self-pay | Source: Ambulatory Visit | Attending: Gastroenterology

## 2016-08-27 ENCOUNTER — Encounter: Payer: Self-pay | Admitting: *Deleted

## 2016-08-27 DIAGNOSIS — Z7982 Long term (current) use of aspirin: Secondary | ICD-10-CM | POA: Diagnosis not present

## 2016-08-27 DIAGNOSIS — I10 Essential (primary) hypertension: Secondary | ICD-10-CM | POA: Diagnosis not present

## 2016-08-27 DIAGNOSIS — E119 Type 2 diabetes mellitus without complications: Secondary | ICD-10-CM | POA: Insufficient documentation

## 2016-08-27 DIAGNOSIS — K640 First degree hemorrhoids: Secondary | ICD-10-CM | POA: Diagnosis not present

## 2016-08-27 DIAGNOSIS — K449 Diaphragmatic hernia without obstruction or gangrene: Secondary | ICD-10-CM | POA: Diagnosis not present

## 2016-08-27 DIAGNOSIS — K2900 Acute gastritis without bleeding: Secondary | ICD-10-CM | POA: Diagnosis not present

## 2016-08-27 DIAGNOSIS — K2950 Unspecified chronic gastritis without bleeding: Secondary | ICD-10-CM | POA: Insufficient documentation

## 2016-08-27 DIAGNOSIS — K210 Gastro-esophageal reflux disease with esophagitis: Secondary | ICD-10-CM | POA: Diagnosis not present

## 2016-08-27 DIAGNOSIS — K2960 Other gastritis without bleeding: Secondary | ICD-10-CM | POA: Insufficient documentation

## 2016-08-27 DIAGNOSIS — K648 Other hemorrhoids: Secondary | ICD-10-CM | POA: Diagnosis not present

## 2016-08-27 DIAGNOSIS — Z794 Long term (current) use of insulin: Secondary | ICD-10-CM | POA: Insufficient documentation

## 2016-08-27 DIAGNOSIS — K259 Gastric ulcer, unspecified as acute or chronic, without hemorrhage or perforation: Secondary | ICD-10-CM | POA: Diagnosis not present

## 2016-08-27 DIAGNOSIS — R1031 Right lower quadrant pain: Secondary | ICD-10-CM | POA: Diagnosis not present

## 2016-08-27 DIAGNOSIS — Z79899 Other long term (current) drug therapy: Secondary | ICD-10-CM | POA: Diagnosis not present

## 2016-08-27 DIAGNOSIS — K2210 Ulcer of esophagus without bleeding: Secondary | ICD-10-CM | POA: Insufficient documentation

## 2016-08-27 DIAGNOSIS — R1011 Right upper quadrant pain: Secondary | ICD-10-CM | POA: Insufficient documentation

## 2016-08-27 DIAGNOSIS — K2990 Gastroduodenitis, unspecified, without bleeding: Secondary | ICD-10-CM | POA: Diagnosis not present

## 2016-08-27 HISTORY — PX: ESOPHAGOGASTRODUODENOSCOPY: SHX5428

## 2016-08-27 HISTORY — PX: COLONOSCOPY WITH PROPOFOL: SHX5780

## 2016-08-27 HISTORY — DX: Major depressive disorder, single episode, unspecified: F32.9

## 2016-08-27 HISTORY — DX: Depression, unspecified: F32.A

## 2016-08-27 LAB — GLUCOSE, CAPILLARY: Glucose-Capillary: 156 mg/dL — ABNORMAL HIGH (ref 65–99)

## 2016-08-27 SURGERY — EGD (ESOPHAGOGASTRODUODENOSCOPY)
Anesthesia: General

## 2016-08-27 MED ORDER — PROPOFOL 500 MG/50ML IV EMUL
INTRAVENOUS | Status: AC
  Filled 2016-08-27: qty 50

## 2016-08-27 MED ORDER — PROPOFOL 10 MG/ML IV BOLUS
INTRAVENOUS | Status: DC | PRN
  Administered 2016-08-27: 30 mg via INTRAVENOUS
  Administered 2016-08-27: 20 mg via INTRAVENOUS
  Administered 2016-08-27: 30 mg via INTRAVENOUS

## 2016-08-27 MED ORDER — FENTANYL CITRATE (PF) 100 MCG/2ML IJ SOLN
INTRAMUSCULAR | Status: DC | PRN
  Administered 2016-08-27: 100 ug via INTRAVENOUS

## 2016-08-27 MED ORDER — FENTANYL CITRATE (PF) 100 MCG/2ML IJ SOLN
INTRAMUSCULAR | Status: AC
  Filled 2016-08-27: qty 2

## 2016-08-27 MED ORDER — MIDAZOLAM HCL 2 MG/2ML IJ SOLN
INTRAMUSCULAR | Status: AC
  Filled 2016-08-27: qty 2

## 2016-08-27 MED ORDER — SODIUM CHLORIDE 0.9 % IV SOLN
INTRAVENOUS | Status: DC
  Administered 2016-08-27: 1000 mL via INTRAVENOUS

## 2016-08-27 MED ORDER — LIDOCAINE HCL (PF) 2 % IJ SOLN
INTRAMUSCULAR | Status: AC
  Filled 2016-08-27: qty 2

## 2016-08-27 MED ORDER — LIDOCAINE HCL (CARDIAC) 20 MG/ML IV SOLN
INTRAVENOUS | Status: DC | PRN
  Administered 2016-08-27: 2 mL via INTRAVENOUS

## 2016-08-27 MED ORDER — LIDOCAINE HCL (PF) 1 % IJ SOLN
2.0000 mL | Freq: Once | INTRAMUSCULAR | Status: AC
  Administered 2016-08-27: 0.3 mL via INTRADERMAL

## 2016-08-27 MED ORDER — LIDOCAINE HCL (PF) 1 % IJ SOLN
INTRAMUSCULAR | Status: AC
  Administered 2016-08-27: 0.3 mL via INTRADERMAL
  Filled 2016-08-27: qty 2

## 2016-08-27 MED ORDER — MIDAZOLAM HCL 2 MG/2ML IJ SOLN
INTRAMUSCULAR | Status: DC | PRN
Start: 2016-08-27 — End: 2016-08-27
  Administered 2016-08-27: 2 mg via INTRAVENOUS

## 2016-08-27 MED ORDER — PROPOFOL 500 MG/50ML IV EMUL
INTRAVENOUS | Status: DC | PRN
  Administered 2016-08-27: 120 ug/kg/min via INTRAVENOUS

## 2016-08-27 MED ORDER — SODIUM CHLORIDE 0.9 % IV SOLN: INTRAVENOUS | Status: DC

## 2016-08-27 NOTE — Op Note (Signed)
[facility] Gastroenterology Patient Name: Miguel Hughes Procedure Date: 08/27/2016 9:17 AM MRN: [id number] Account #: [id number] Date of Birth: 22-Feb-1966 Admit Type: Outpatient Age: 51 Room: [facility] ENDO ROOM 3 Gender: Male Note Status: Finalized Procedure:            Colonoscopy Indications:          Abdominal pain in the right lower quadrant, Abdominal                        pain in the right upper quadrant Providers:            Lollie Sails, MD Medicines:            Monitored Anesthesia Care Complications:        No immediate complications. Procedure:            Pre-Anesthesia Assessment:                       - ASA Grade Assessment: II - A patient with mild                        systemic disease.                       After obtaining informed consent, the colonoscope was                        passed under direct vision. Throughout the procedure,                        the patient's blood pressure, pulse, and oxygen                        saturations were monitored continuously. The                        Colonoscope was introduced through the anus and                        advanced to the the cecum, identified by appendiceal                        orifice and ileocecal valve. The colonoscopy was                        performed without difficulty. The patient tolerated the                        procedure well. The quality of the bowel preparation                        was fair. Findings:      Non-bleeding internal hemorrhoids were found during anoscopy. The       hemorrhoids were small to medium and Grade I (internal hemorrhoids that       do not prolapse).      The digital rectal exam was normal otherwise.      The exam was otherwise without abnormality.      Of note there is a moderate amount of apparent spasm throughout the       colon. Impression:           -  Preparation of the colon was fair.                       - Non-bleeding internal  hemorrhoids.                       - No specimens collected. Recommendation:       - Use Levsin, NuLev (hyoscyamine) 0.125 mg 1-2 tabs SL                        q 4 hours PRN. Procedure Code(s):    --- Professional ---                       [phone], Colonoscopy, flexible; diagnostic, including                        collection of specimen(s) by brushing or washing, when                        performed (separate procedure) Diagnosis Code(s):    --- Professional ---                       K64.0, First degree hemorrhoids                       R10.31, Right lower quadrant pain                       R10.11, Right upper quadrant pain CPT copyright 2016 American Medical Association. All rights reserved. The codes documented in this report are preliminary and upon coder review may  be revised to meet current compliance requirements. Lollie Sails, MD 08/27/2016 10:24:48 AM This report has been signed electronically. Number of Addenda: 0 Note Initiated On: 08/27/2016 9:17 AM Scope Withdrawal Time: 0 hours 5 minutes 31 seconds  Total Procedure Duration: 0 hours 18 minutes 27 seconds       [facility]

## 2016-08-27 NOTE — Anesthesia Preprocedure Evaluation (Signed)
Anesthesia Evaluation  Patient identified by MRN, date of birth, ID band Patient awake    Reviewed: Allergy & Precautions, NPO status , Patient's Chart, lab work & pertinent test results  Airway Mallampati: II       Dental  (+) Teeth Intact   Pulmonary neg pulmonary ROS,    breath sounds clear to auscultation       Cardiovascular Exercise Tolerance: Good hypertension, Pt. on medications  Rhythm:Regular Rate:Normal     Neuro/Psych  Headaches, Depression    GI/Hepatic negative GI ROS, Neg liver ROS,   Endo/Other  diabetes, Type 1, Insulin Dependent  Renal/GU negative Renal ROS     Musculoskeletal negative musculoskeletal ROS (+)   Abdominal Normal abdominal exam  (+)   Peds negative pediatric ROS (+)  Hematology negative hematology ROS (+)   Anesthesia Other Findings   Reproductive/Obstetrics                             Anesthesia Physical Anesthesia Plan  ASA: II  Anesthesia Plan: General   Post-op Pain Management:    Induction: Intravenous  Airway Management Planned: Natural Airway and Nasal Cannula  Additional Equipment:   Intra-op Plan:   Post-operative Plan:   Informed Consent: I have reviewed the patients History and Physical, chart, labs and discussed the procedure including the risks, benefits and alternatives for the proposed anesthesia with the patient or authorized representative who has indicated his/her understanding and acceptance.     Plan Discussed with: Surgeon  Anesthesia Plan Comments:         Anesthesia Quick Evaluation

## 2016-08-27 NOTE — Transfer of Care (Signed)
Immediate Anesthesia Transfer of Care Note  Patient: Miguel Hughes  Procedure(s) Performed: Procedure(s): ESOPHAGOGASTRODUODENOSCOPY (EGD) (N/A) COLONOSCOPY WITH PROPOFOL (N/A)  Patient Location: PACU  Anesthesia Type:General  Level of Consciousness: awake and alert   Airway & Oxygen Therapy: Patient Spontanous Breathing and Patient connected to nasal cannula oxygen  Post-op Assessment: Report given to RN and Post -op Vital signs reviewed and stable  Post vital signs: Reviewed  Last Vitals:  Vitals:   08/27/16 0848  BP: (!) 143/90  Pulse: 81  Resp: 16  Temp: 36.2 C    Last Pain:  Vitals:   08/27/16 0848  TempSrc: Tympanic  PainSc:          Complications: No apparent anesthesia complications

## 2016-08-27 NOTE — Op Note (Signed)
[facility] Gastroenterology Patient Name: Miguel Hughes Procedure Date: 08/27/2016 9:18 AM MRN: [id number] Account #: [id number] Date of Birth: 16-May-1966 Admit Type: Outpatient Age: 51 Room: [facility] ENDO ROOM 3 Gender: Male Note Status: Finalized Procedure:            Upper GI endoscopy Indications:          Abdominal pain in the right upper quadrant, Abdominal                        pain in the right lower quadrant, Dyspepsia Providers:            Lollie Sails, MD Referring MD:         Dion Body (Referring MD) Medicines:            Monitored Anesthesia Care Complications:        No immediate complications. Procedure:            Pre-Anesthesia Assessment:                       - ASA Grade Assessment: II - A patient with mild                        systemic disease.                       After obtaining informed consent, the endoscope was                        passed under direct vision. Throughout the procedure,                        the patient's blood pressure, pulse, and oxygen                        saturations were monitored continuously. The Endoscope                        was introduced through the mouth, and advanced to the                        third part of duodenum. The upper GI endoscopy was                        performed with moderate difficulty due to patient                        coughing reflex. The patient tolerated the procedure. Findings:      LA Grade A (one or more mucosal breaks less than 5 mm, not extending       between tops of 2 mucosal folds) esophagitis with no bleeding was found.       Biopsies were taken with a cold forceps for histology.      Patchy mild inflammation characterized by congestion (edema) and       erosions was found in the gastric body. Biopsies were taken with a cold       forceps for histology. Biopsies were taken with a cold forceps for       Helicobacter pylori testing.      Patchy mild  inflammation characterized by congestion (edema), erosions  and erythema was found in the gastric antrum. Biopsies were taken with a       cold forceps for histology. Biopsies were taken with a cold forceps for       Helicobacter pylori testing.      The cardia and gastric fundus were normal on retroflexion.      A single non-bleeding localized erosion was found on the posterior wall       of the gastric body. There were no stigmata of recent bleeding. Biopsies       were taken with a cold forceps for histology.      Patchy mild inflammation characterized by congestion (edema) and       erythema was found in the duodenal bulb. Impression:           - LA Grade A erosive esophagitis. Biopsied.                       - Erosive gastritis. Biopsied.                       - Erosive gastritis. Biopsied.                       - Normal.                       - Gastric erosion without bleeding. Biopsied. Recommendation:       - Discharge patient to home.                       - Continue present medications.                       - Await pathology results. Procedure Code(s):    --- Professional ---                       [phone], Esophagogastroduodenoscopy, flexible, transoral;                        with biopsy, single or multiple Diagnosis Code(s):    --- Professional ---                       K20.8, Other esophagitis                       K29.60, Other gastritis without bleeding                       K25.9, Gastric ulcer, unspecified as acute or chronic,                        without hemorrhage or perforation                       R10.11, Right upper quadrant pain                       R10.31, Right lower quadrant pain                       R10.13, Epigastric pain CPT copyright 2016 American Medical Association. All rights reserved. The codes documented in this report are preliminary and upon coder review may  be revised to meet current  compliance requirements. Lollie Sails,  MD 08/27/2016 9:59:26 AM This report has been signed electronically. Number of Addenda: 0 Note Initiated On: 08/27/2016 9:18 AM      [facility]

## 2016-08-27 NOTE — Anesthesia Postprocedure Evaluation (Signed)
Anesthesia Post Note  Patient: Miguel Hughes  Procedure(s) Performed: Procedure(s) (LRB): ESOPHAGOGASTRODUODENOSCOPY (EGD) (N/A) COLONOSCOPY WITH PROPOFOL (N/A)  Patient location during evaluation: PACU Anesthesia Type: General Level of consciousness: awake Pain management: pain level controlled Vital Signs Assessment: post-procedure vital signs reviewed and stable Respiratory status: nonlabored ventilation Cardiovascular status: stable Anesthetic complications: no     Last Vitals:  Vitals:   08/27/16 1046 08/27/16 1056  BP: 131/89 (!) 134/94  Pulse: 79 83  Resp: 11 19  Temp:      Last Pain:  Vitals:   08/27/16 1026  TempSrc: Tympanic  PainSc: 0-No pain                 VAN STAVEREN,Leiyah Maultsby

## 2016-08-27 NOTE — Anesthesia Post-op Follow-up Note (Signed)
Anesthesia QCDR form completed.        

## 2016-08-27 NOTE — H&P (Signed)
Outpatient short stay form Pre-procedure 08/27/2016 9:23 AM Lollie Sails MD  Primary Physician: Dr Meriel Flavors  Reason for visit:  EGD and colonoscopy  History of present illness:  Patient is a 51 year old male presenting today as above. He is been having some problems with what he describes as a firm swelling in the right side of the abdomen associated with pain. Is also seen small amounts of rectal bleeding that was associated with a painful bowel movement. He also has been having some dyspepsia and symptoms of heartburn. He has started taking a proton pump inhibitor and this is been beneficial. He is also been started on a low dose of Citrucel daily. He has had a CT scan of the abdomen and pelvis on 06/30/2016 showing no acute findings the abdomen or pelvis. The bowel and stomach appeared normal in appearance or is no abnormal dilatation of the small bowel or colon and a normal appendix. Some probable hepatic cysts without biliary dilatation. He tolerated his prep well. He does take an occasional 325 mg aspirin as well as occasional NSAID but has not recently.    Current Facility-Administered Medications:  .  0.9 %  sodium chloride infusion, , Intravenous, Continuous, Lollie Sails, MD, Last Rate: 20 mL/hr at 08/27/16 0911, 1,000 mL at 08/27/16 0911 .  0.9 %  sodium chloride infusion, , Intravenous, Continuous, Lollie Sails, MD  Prescriptions Prior to Admission  Medication Sig Dispense Refill Last Dose  . aspirin 325 MG tablet Take by mouth every 4 (four) hours as needed.    Past Month at Unknown time  . ibuprofen (ADVIL,MOTRIN) 200 MG tablet Take 200 mg by mouth every 6 (six) hours as needed.   Past Month at Unknown time  . linagliptin (TRADJENTA) 5 MG TABS tablet Take 5 mg by mouth daily.     . cyclobenzaprine (FLEXERIL) 10 MG tablet TAKE 1 TABLET BY MOUTH 3 TIMES A DAY AS NEEDED MUSCLE SPASM  5 Taking  . Insulin Glargine (TOUJEO SOLOSTAR) 300 UNIT/ML SOPN Inject 4 Units  into the skin as needed. Reported on 09/17/2015   Not Taking  . lisinopril (PRINIVIL,ZESTRIL) 20 MG tablet Take by mouth.   Taking  . metFORMIN (GLUCOPHAGE) 1000 MG tablet Take by mouth.   Taking     No Known Allergies   Past Medical History:  Diagnosis Date  . Anxiety and depression   . Depression   . Diabetes [facility]) 2010   Dr Eddie Dibbles  . Headache   . Heartburn   . HTN (hypertension)   . Memory loss     Review of systems:      Physical Exam    Heart and lungs: Regular rate and rhythm without rub or gallop, lungs are bilaterally clear.    HEENT: Normocephalic atraumatic eyes are anicteric    Other:     Pertinant exam for procedure: Soft nontender nondistended bowel sounds positive normoactive.    Planned proceedures: EGD, colonoscopy and indicated procedures. I have discussed the risks benefits and complications of procedures to include not limited to bleeding, infection, perforation and the risk of sedation and the patient wishes to proceed.    Lollie Sails, MD Gastroenterology 08/27/2016  9:23 AM

## 2016-08-28 ENCOUNTER — Encounter: Payer: Self-pay | Admitting: Gastroenterology

## 2016-08-28 LAB — SURGICAL PATHOLOGY

## 2016-10-09 ENCOUNTER — Other Ambulatory Visit
Admission: RE | Admit: 2016-10-09 | Discharge: 2016-10-09 | Disposition: A | Discharge status: Home / Self Care | Payer: 59 | Source: Ambulatory Visit | Attending: Gastroenterology | Admitting: Gastroenterology

## 2016-10-09 DIAGNOSIS — R197 Diarrhea, unspecified: Secondary | ICD-10-CM | POA: Diagnosis present

## 2016-10-09 LAB — C DIFFICILE QUICK SCREEN W PCR REFLEX
C Diff antigen: NEGATIVE
C Diff interpretation: NOT DETECTED
C Diff toxin: NEGATIVE

## 2016-10-09 LAB — GASTROINTESTINAL PANEL BY PCR, STOOL (REPLACES STOOL CULTURE)

## 2016-10-13 DIAGNOSIS — R197 Diarrhea, unspecified: Secondary | ICD-10-CM | POA: Diagnosis not present

## 2016-10-14 DIAGNOSIS — R197 Diarrhea, unspecified: Secondary | ICD-10-CM | POA: Diagnosis not present

## 2016-10-14 DIAGNOSIS — E119 Type 2 diabetes mellitus without complications: Secondary | ICD-10-CM | POA: Diagnosis not present

## 2016-10-21 DIAGNOSIS — R51 Headache: Secondary | ICD-10-CM | POA: Diagnosis not present

## 2016-10-21 DIAGNOSIS — R251 Tremor, unspecified: Secondary | ICD-10-CM | POA: Diagnosis not present

## 2016-10-21 DIAGNOSIS — R413 Other amnesia: Secondary | ICD-10-CM | POA: Diagnosis not present

## 2016-10-30 ENCOUNTER — Other Ambulatory Visit: Payer: Self-pay | Admitting: Neurology

## 2016-10-30 DIAGNOSIS — R413 Other amnesia: Secondary | ICD-10-CM

## 2016-10-31 DIAGNOSIS — R404 Transient alteration of awareness: Secondary | ICD-10-CM | POA: Diagnosis not present

## 2016-11-05 ENCOUNTER — Ambulatory Visit: Payer: 59

## 2016-11-18 ENCOUNTER — Ambulatory Visit: Payer: 59

## 2016-11-18 ENCOUNTER — Ambulatory Visit
Admission: RE | Admit: 2016-11-18 | Discharge: 2016-11-18 | Disposition: A | Discharge status: Home / Self Care | Payer: 59 | Source: Ambulatory Visit | Attending: Neurology | Admitting: Neurology

## 2016-11-18 DIAGNOSIS — G44229 Chronic tension-type headache, not intractable: Secondary | ICD-10-CM | POA: Insufficient documentation

## 2016-11-18 DIAGNOSIS — R413 Other amnesia: Secondary | ICD-10-CM | POA: Diagnosis not present

## 2016-11-18 LAB — POCT I-STAT CREATININE: Creatinine, Ser: 1 mg/dL (ref 0.61–1.24)

## 2016-11-18 MED ORDER — IOPAMIDOL (ISOVUE-370) INJECTION 76%
75.0000 mL | Freq: Once | INTRAVENOUS | Status: AC | PRN
  Administered 2016-11-18: 75 mL via INTRAVENOUS

## 2017-01-13 DIAGNOSIS — G4733 Obstructive sleep apnea (adult) (pediatric): Secondary | ICD-10-CM | POA: Diagnosis not present

## 2017-01-19 DIAGNOSIS — G44229 Chronic tension-type headache, not intractable: Secondary | ICD-10-CM | POA: Diagnosis not present

## 2017-01-19 DIAGNOSIS — R51 Headache: Secondary | ICD-10-CM | POA: Diagnosis not present

## 2017-01-19 DIAGNOSIS — R404 Transient alteration of awareness: Secondary | ICD-10-CM | POA: Diagnosis not present

## 2017-02-16 DIAGNOSIS — G4733 Obstructive sleep apnea (adult) (pediatric): Secondary | ICD-10-CM | POA: Diagnosis not present

## 2017-03-19 DIAGNOSIS — G4733 Obstructive sleep apnea (adult) (pediatric): Secondary | ICD-10-CM | POA: Diagnosis not present

## 2017-04-14 DIAGNOSIS — Z8639 Personal history of other endocrine, nutritional and metabolic disease: Secondary | ICD-10-CM | POA: Diagnosis not present

## 2017-04-14 DIAGNOSIS — D509 Iron deficiency anemia, unspecified: Secondary | ICD-10-CM | POA: Diagnosis not present

## 2017-04-14 DIAGNOSIS — E119 Type 2 diabetes mellitus without complications: Secondary | ICD-10-CM | POA: Diagnosis not present

## 2017-04-14 DIAGNOSIS — E291 Testicular hypofunction: Secondary | ICD-10-CM | POA: Diagnosis not present

## 2017-04-14 DIAGNOSIS — E7800 Pure hypercholesterolemia, unspecified: Secondary | ICD-10-CM | POA: Diagnosis not present

## 2017-04-18 DIAGNOSIS — G4733 Obstructive sleep apnea (adult) (pediatric): Secondary | ICD-10-CM | POA: Diagnosis not present

## 2017-04-21 DIAGNOSIS — E7800 Pure hypercholesterolemia, unspecified: Secondary | ICD-10-CM | POA: Diagnosis not present

## 2017-04-21 DIAGNOSIS — E119 Type 2 diabetes mellitus without complications: Secondary | ICD-10-CM | POA: Diagnosis not present

## 2017-04-21 DIAGNOSIS — Z0000 Encounter for general adult medical examination without abnormal findings: Secondary | ICD-10-CM | POA: Diagnosis not present

## 2017-05-04 DIAGNOSIS — R51 Headache: Secondary | ICD-10-CM | POA: Diagnosis not present

## 2017-05-04 DIAGNOSIS — R55 Syncope and collapse: Secondary | ICD-10-CM | POA: Diagnosis not present

## 2017-05-19 DIAGNOSIS — G4733 Obstructive sleep apnea (adult) (pediatric): Secondary | ICD-10-CM | POA: Diagnosis not present

## 2017-06-18 DIAGNOSIS — G4733 Obstructive sleep apnea (adult) (pediatric): Secondary | ICD-10-CM | POA: Diagnosis not present

## 2017-07-09 DIAGNOSIS — R55 Syncope and collapse: Secondary | ICD-10-CM | POA: Diagnosis not present

## 2017-07-10 DIAGNOSIS — R55 Syncope and collapse: Secondary | ICD-10-CM | POA: Diagnosis not present

## 2017-07-11 DIAGNOSIS — R55 Syncope and collapse: Secondary | ICD-10-CM | POA: Diagnosis not present

## 2017-07-13 DIAGNOSIS — G4733 Obstructive sleep apnea (adult) (pediatric): Secondary | ICD-10-CM | POA: Diagnosis not present

## 2017-07-19 DIAGNOSIS — G4733 Obstructive sleep apnea (adult) (pediatric): Secondary | ICD-10-CM | POA: Diagnosis not present

## 2017-08-03 DIAGNOSIS — G4733 Obstructive sleep apnea (adult) (pediatric): Secondary | ICD-10-CM | POA: Diagnosis not present

## 2017-08-03 DIAGNOSIS — D2320 Other benign neoplasm of skin of unspecified ear and external auricular canal: Secondary | ICD-10-CM | POA: Diagnosis not present

## 2017-08-03 DIAGNOSIS — J383 Other diseases of vocal cords: Secondary | ICD-10-CM | POA: Diagnosis not present

## 2017-08-03 DIAGNOSIS — K219 Gastro-esophageal reflux disease without esophagitis: Secondary | ICD-10-CM | POA: Diagnosis not present

## 2017-08-04 DIAGNOSIS — R55 Syncope and collapse: Secondary | ICD-10-CM | POA: Diagnosis not present

## 2017-08-04 DIAGNOSIS — R404 Transient alteration of awareness: Secondary | ICD-10-CM | POA: Diagnosis not present

## 2017-08-04 DIAGNOSIS — R51 Headache: Secondary | ICD-10-CM | POA: Diagnosis not present

## 2017-08-11 DIAGNOSIS — D2320 Other benign neoplasm of skin of unspecified ear and external auricular canal: Secondary | ICD-10-CM | POA: Diagnosis not present

## 2017-08-11 DIAGNOSIS — L821 Other seborrheic keratosis: Secondary | ICD-10-CM | POA: Diagnosis not present

## 2017-08-19 DIAGNOSIS — G4733 Obstructive sleep apnea (adult) (pediatric): Secondary | ICD-10-CM | POA: Diagnosis not present

## 2017-09-16 DIAGNOSIS — G4733 Obstructive sleep apnea (adult) (pediatric): Secondary | ICD-10-CM | POA: Diagnosis not present

## 2017-10-13 DIAGNOSIS — E7800 Pure hypercholesterolemia, unspecified: Secondary | ICD-10-CM | POA: Diagnosis not present

## 2017-10-13 DIAGNOSIS — E559 Vitamin D deficiency, unspecified: Secondary | ICD-10-CM | POA: Diagnosis not present

## 2017-10-13 DIAGNOSIS — E119 Type 2 diabetes mellitus without complications: Secondary | ICD-10-CM | POA: Diagnosis not present

## 2017-10-17 DIAGNOSIS — G4733 Obstructive sleep apnea (adult) (pediatric): Secondary | ICD-10-CM | POA: Diagnosis not present

## 2017-10-20 DIAGNOSIS — E7800 Pure hypercholesterolemia, unspecified: Secondary | ICD-10-CM | POA: Diagnosis not present

## 2017-10-20 DIAGNOSIS — E559 Vitamin D deficiency, unspecified: Secondary | ICD-10-CM | POA: Diagnosis not present

## 2017-10-20 DIAGNOSIS — E119 Type 2 diabetes mellitus without complications: Secondary | ICD-10-CM | POA: Diagnosis not present

## 2017-11-16 DIAGNOSIS — G4733 Obstructive sleep apnea (adult) (pediatric): Secondary | ICD-10-CM | POA: Diagnosis not present

## 2018-01-25 DIAGNOSIS — H35341 Macular cyst, hole, or pseudohole, right eye: Secondary | ICD-10-CM | POA: Diagnosis not present

## 2018-02-09 DIAGNOSIS — E119 Type 2 diabetes mellitus without complications: Secondary | ICD-10-CM | POA: Diagnosis not present

## 2018-02-09 DIAGNOSIS — E559 Vitamin D deficiency, unspecified: Secondary | ICD-10-CM | POA: Diagnosis not present

## 2018-02-09 DIAGNOSIS — E7800 Pure hypercholesterolemia, unspecified: Secondary | ICD-10-CM | POA: Diagnosis not present

## 2018-02-16 DIAGNOSIS — E559 Vitamin D deficiency, unspecified: Secondary | ICD-10-CM | POA: Diagnosis not present

## 2018-02-16 DIAGNOSIS — E7800 Pure hypercholesterolemia, unspecified: Secondary | ICD-10-CM | POA: Diagnosis not present

## 2018-02-16 DIAGNOSIS — E119 Type 2 diabetes mellitus without complications: Secondary | ICD-10-CM | POA: Diagnosis not present

## 2018-06-30 DIAGNOSIS — E7800 Pure hypercholesterolemia, unspecified: Secondary | ICD-10-CM | POA: Diagnosis not present

## 2018-06-30 DIAGNOSIS — E119 Type 2 diabetes mellitus without complications: Secondary | ICD-10-CM | POA: Diagnosis not present

## 2018-06-30 DIAGNOSIS — E559 Vitamin D deficiency, unspecified: Secondary | ICD-10-CM | POA: Diagnosis not present

## 2018-07-07 DIAGNOSIS — E1169 Type 2 diabetes mellitus with other specified complication: Secondary | ICD-10-CM | POA: Diagnosis not present

## 2018-07-07 DIAGNOSIS — Z0000 Encounter for general adult medical examination without abnormal findings: Secondary | ICD-10-CM | POA: Diagnosis not present

## 2018-07-07 DIAGNOSIS — E7800 Pure hypercholesterolemia, unspecified: Secondary | ICD-10-CM | POA: Diagnosis not present

## 2018-08-02 DIAGNOSIS — G44209 Tension-type headache, unspecified, not intractable: Secondary | ICD-10-CM | POA: Diagnosis not present

## 2018-08-02 DIAGNOSIS — I10 Essential (primary) hypertension: Secondary | ICD-10-CM | POA: Diagnosis not present

## 2018-08-02 DIAGNOSIS — E785 Hyperlipidemia, unspecified: Secondary | ICD-10-CM | POA: Diagnosis not present

## 2018-08-02 DIAGNOSIS — R55 Syncope and collapse: Secondary | ICD-10-CM | POA: Diagnosis not present

## 2018-08-20 DIAGNOSIS — R55 Syncope and collapse: Secondary | ICD-10-CM | POA: Diagnosis not present

## 2018-08-24 DIAGNOSIS — R55 Syncope and collapse: Secondary | ICD-10-CM | POA: Diagnosis not present

## 2018-08-26 DIAGNOSIS — R55 Syncope and collapse: Secondary | ICD-10-CM | POA: Diagnosis not present

## 2018-11-16 DIAGNOSIS — E785 Hyperlipidemia, unspecified: Secondary | ICD-10-CM | POA: Diagnosis not present

## 2018-11-16 DIAGNOSIS — E569 Vitamin deficiency, unspecified: Secondary | ICD-10-CM | POA: Diagnosis not present

## 2018-11-16 DIAGNOSIS — Z862 Personal history of diseases of the blood and blood-forming organs and certain disorders involving the immune mechanism: Secondary | ICD-10-CM | POA: Diagnosis not present

## 2018-11-16 DIAGNOSIS — E1169 Type 2 diabetes mellitus with other specified complication: Secondary | ICD-10-CM | POA: Diagnosis not present

## 2018-11-16 DIAGNOSIS — E7800 Pure hypercholesterolemia, unspecified: Secondary | ICD-10-CM | POA: Diagnosis not present

## 2018-11-23 DIAGNOSIS — E1169 Type 2 diabetes mellitus with other specified complication: Secondary | ICD-10-CM | POA: Diagnosis not present

## 2018-11-23 DIAGNOSIS — E7800 Pure hypercholesterolemia, unspecified: Secondary | ICD-10-CM | POA: Diagnosis not present

## 2018-11-23 DIAGNOSIS — R197 Diarrhea, unspecified: Secondary | ICD-10-CM | POA: Diagnosis not present

## 2018-12-13 IMAGING — CT CT ANGIO HEAD
1 of 5 series · 12 of 30 positions shown · IV contrast (APPLIED)
Comparison: Head MRI and MRA 01/04/2013

CLINICAL DATA: Memory loss, worsening headaches, vertigo, and
unsteady gait.

EXAM:
CT ANGIOGRAPHY HEAD
TECHNIQUE: Multidetector CT imaging of the head was performed using the
standard protocol during bolus administration of intravenous
contrast. Multiplanar CT image reconstructions and MIPs were
obtained to evaluate the vascular anatomy.
CONTRAST:  75 mL Isovue 370

[Series 14: ax thin · axial · 0.36mm/px · z∈[-58,+66]mm · 12 of 148 slices shown]
[im 12/148  brain]
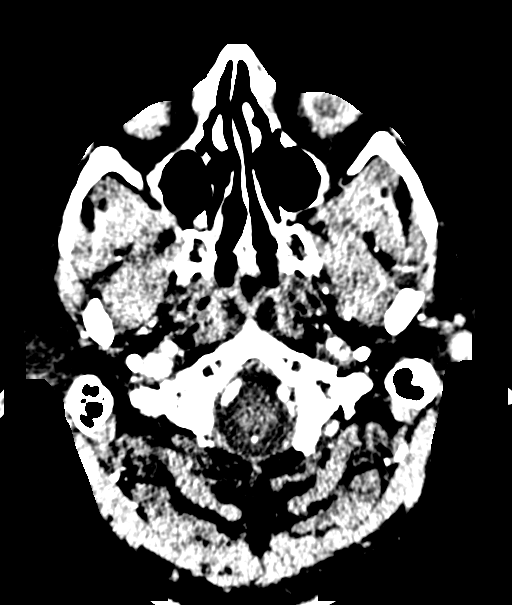
[im 23/148  bone]
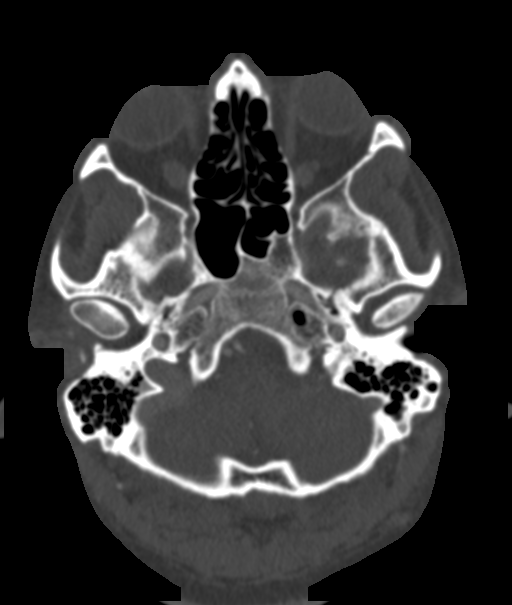
[im 34/148  brain]
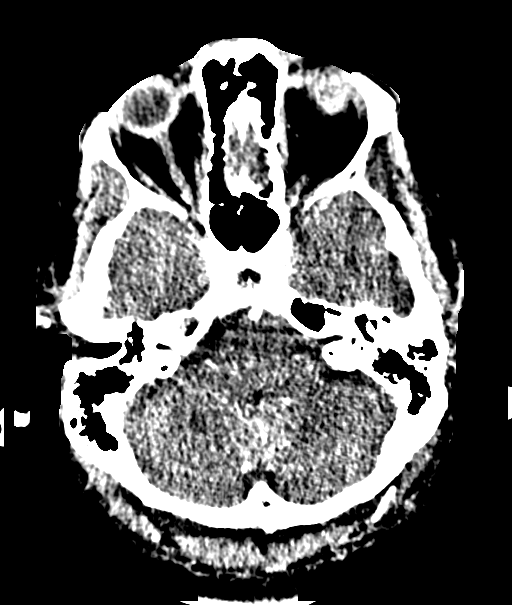
[im 46/148  bone]
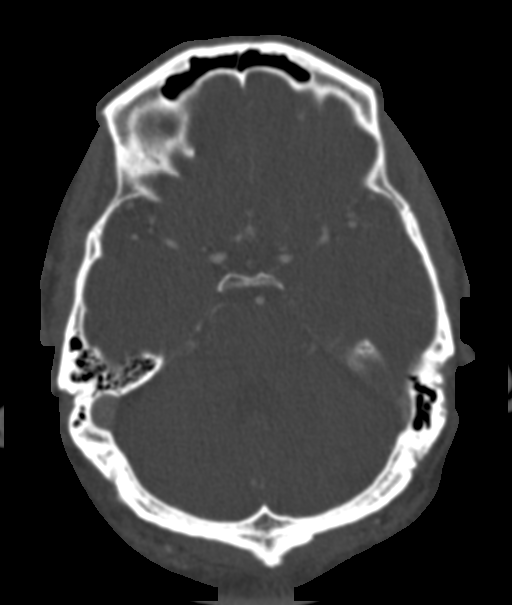
[im 57/148  brain]
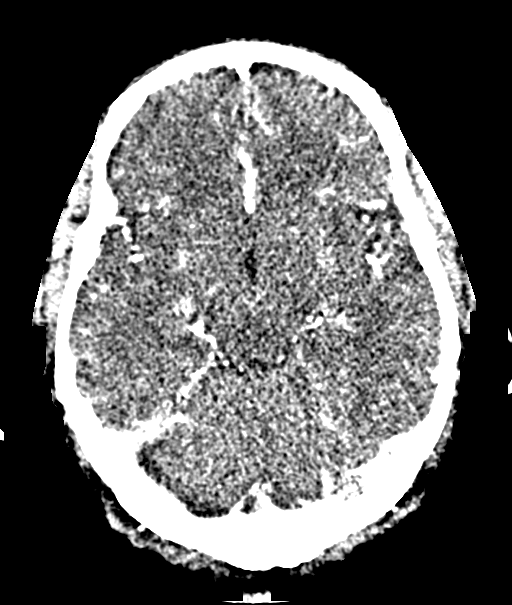
[im 68/148  bone]
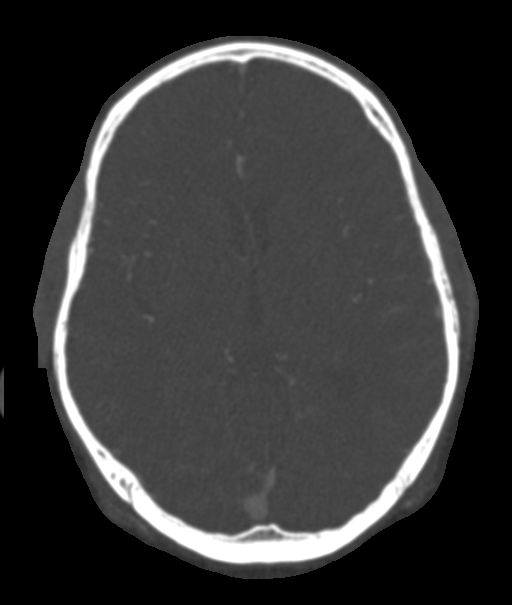
[im 80/148  brain]
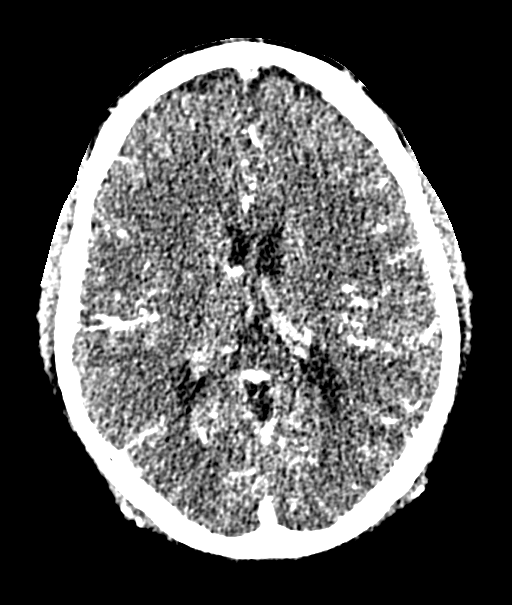
[im 91/148  bone]
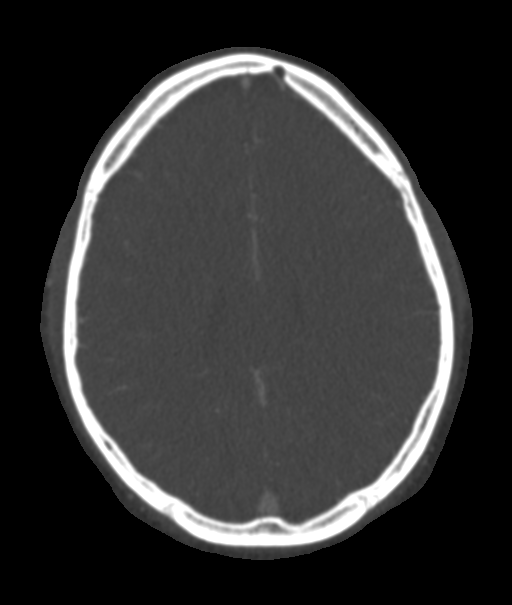
[im 102/148  brain]
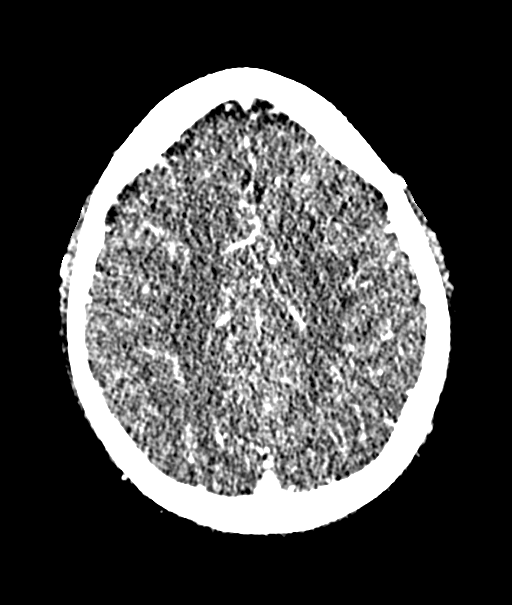
[im 114/148  bone]
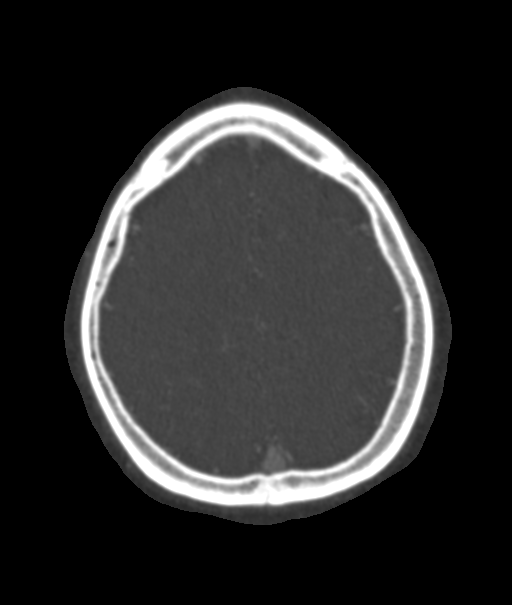
[im 125/148  brain]
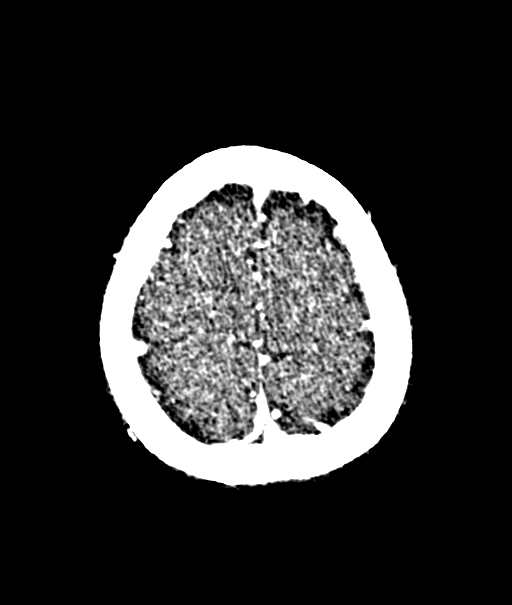
[im 136/148  bone]
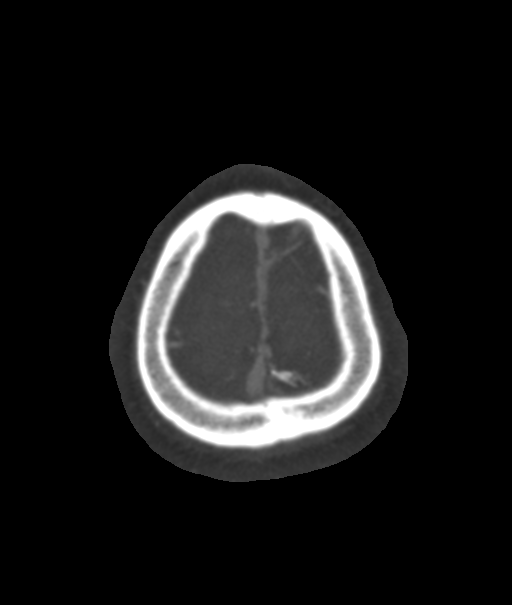

[12 of 30 positions shown; findings below may reference images not displayed]

FINDINGS: CT HEAD

Brain: There is no evidence of acute infarct, intracranial
hemorrhage, mass, midline shift, or extra-axial fluid collection.
The ventricles and sulci are normal.

Vascular: No hyperdense vessel.

Skull: No fracture or focal osseous lesion.

Sinuses: Visualized paranasal sinuses and mastoid air cells are
clear.

Orbits: Visualized portions of the orbits are unremarkable.

CTA HEAD

Anterior circulation: The internal carotid arteries are widely
patent from skullbase to carotid termini. ACAs and MCAs are patent
without evidence of proximal branch occlusion or significant
stenosis. No aneurysm.

Posterior circulation: The visualized distal vertebral arteries are
patent to the basilar and codominant. Patent PICA and SCA origins
are visualized bilaterally. The basilar artery is widely patent.
There are diminutive bilateral posterior communicating arteries.
PCAs are patent without evidence of significant stenosis. No
aneurysm.

Venous sinuses: Patent.

Anatomic variants: None.

Delayed phase: No abnormal enhancement.
IMPRESSION: Negative head CTA.
